# Patient Record
Sex: Male | Born: 1985 | Race: White | Hispanic: Yes | Marital: Married | Smoking: Current every day smoker
Health system: Southern US, Community
[De-identification: ages and names within clinical notes are randomized; demographics above are authoritative.]

---

## 2009-09-16 ENCOUNTER — Encounter: Admission: RE | Admit: 2009-09-16 | Discharge: 2009-09-16 | Payer: Self-pay | Admitting: Family Medicine

## 2010-09-23 ENCOUNTER — Encounter: Admission: RE | Admit: 2010-09-23 | Discharge: 2010-09-23 | Payer: Self-pay | Admitting: Family Medicine

## 2010-11-04 IMAGING — CR DG CHEST 1V
1 series · 1 of 1 positions shown · non-contrast
Comparison: None

CLINICAL DATA: Nearly physical examination for employment.  The
patient is a smoker.

CHEST - 1 VIEW

[view not recorded]
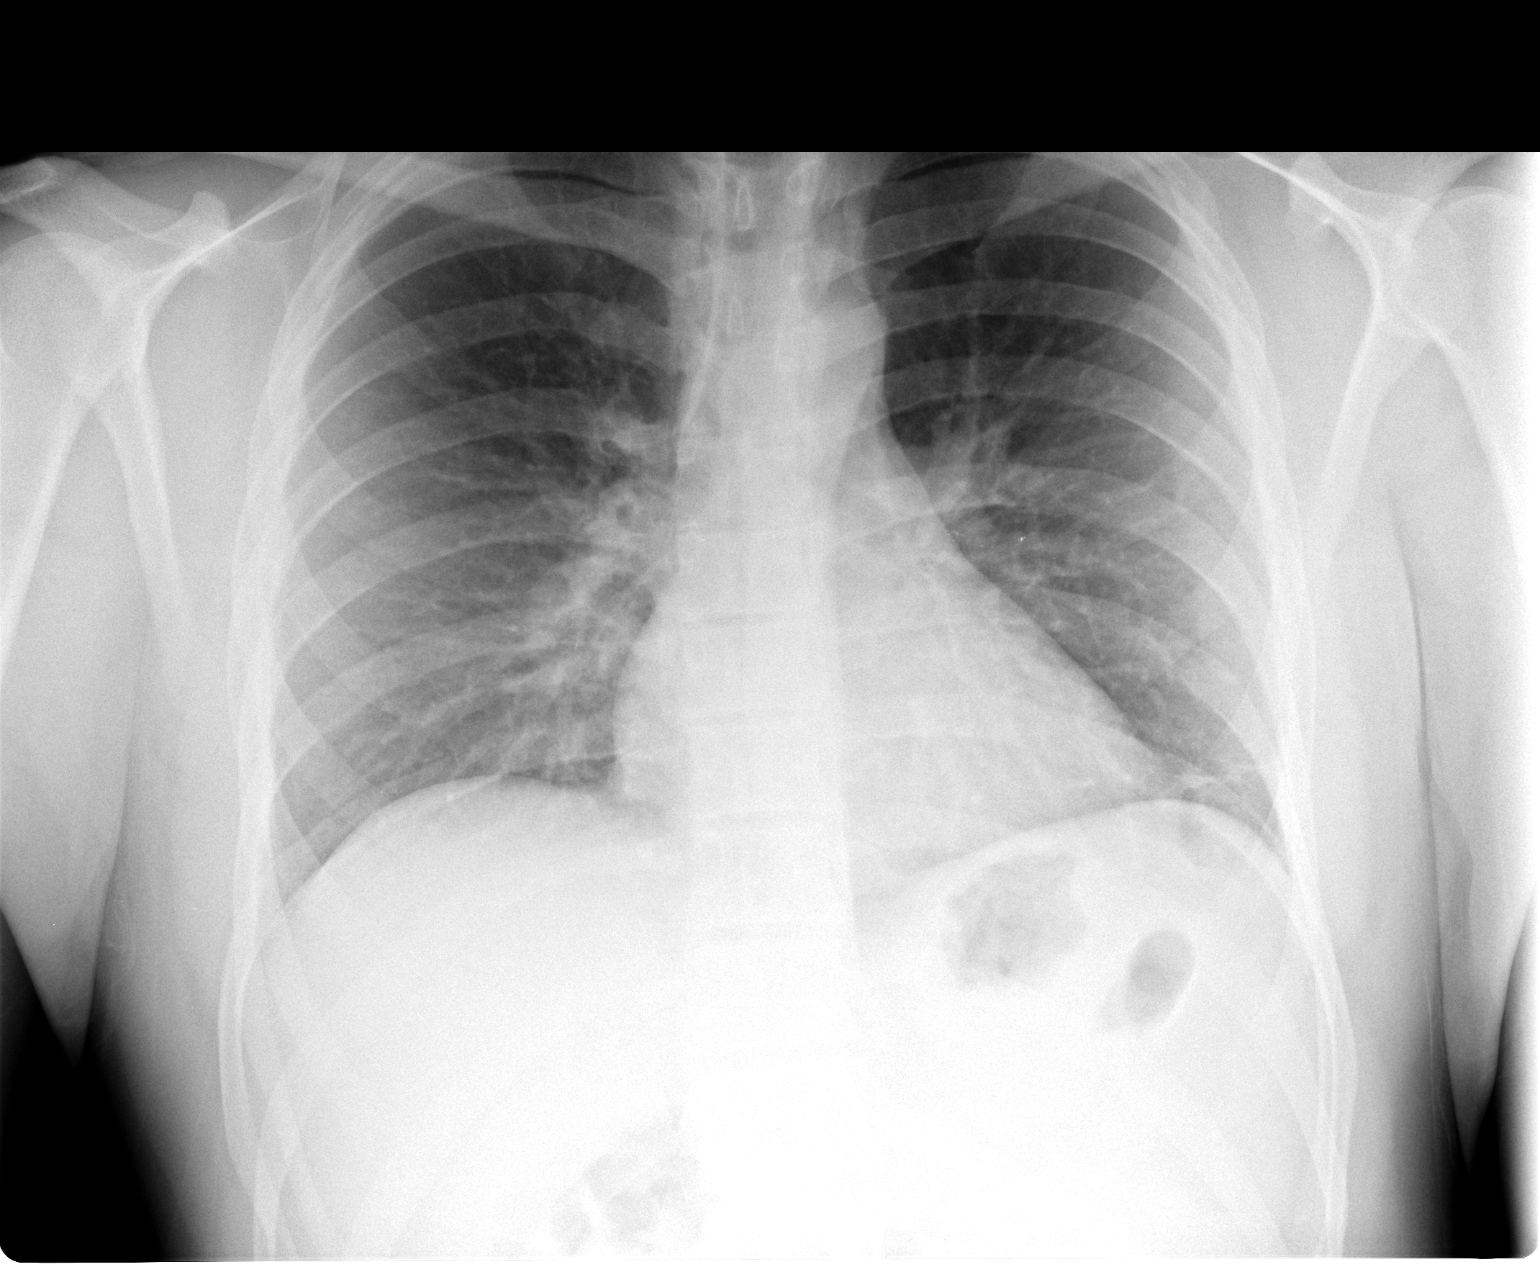

[1 of 1 positions shown; findings below may reference images not displayed]

FINDINGS: Minimal bibasilar scarring and atelectasis present.  No
evidence of edema or infiltrate.  Heart size and mediastinal
contours are normal.  The bony thorax is unremarkable.
IMPRESSION: No active disease.  Minimal bibasilar scarring/atelectasis.

## 2011-11-20 ENCOUNTER — Other Ambulatory Visit: Payer: Self-pay | Admitting: Occupational Medicine

## 2011-11-20 ENCOUNTER — Ambulatory Visit
Admission: RE | Admit: 2011-11-20 | Discharge: 2011-11-20 | Disposition: A | Discharge status: Home / Self Care | Payer: Self-pay | Source: Ambulatory Visit | Attending: Occupational Medicine | Admitting: Occupational Medicine

## 2011-11-20 DIAGNOSIS — Z0000 Encounter for general adult medical examination without abnormal findings: Secondary | ICD-10-CM

## 2013-01-07 IMAGING — CR DG CHEST 1V
1 series · 1 of 1 positions shown · non-contrast
Comparison: 09/23/2010

CLINICAL DATA: Physical exam.

CHEST - 1 VIEW

[view not recorded]
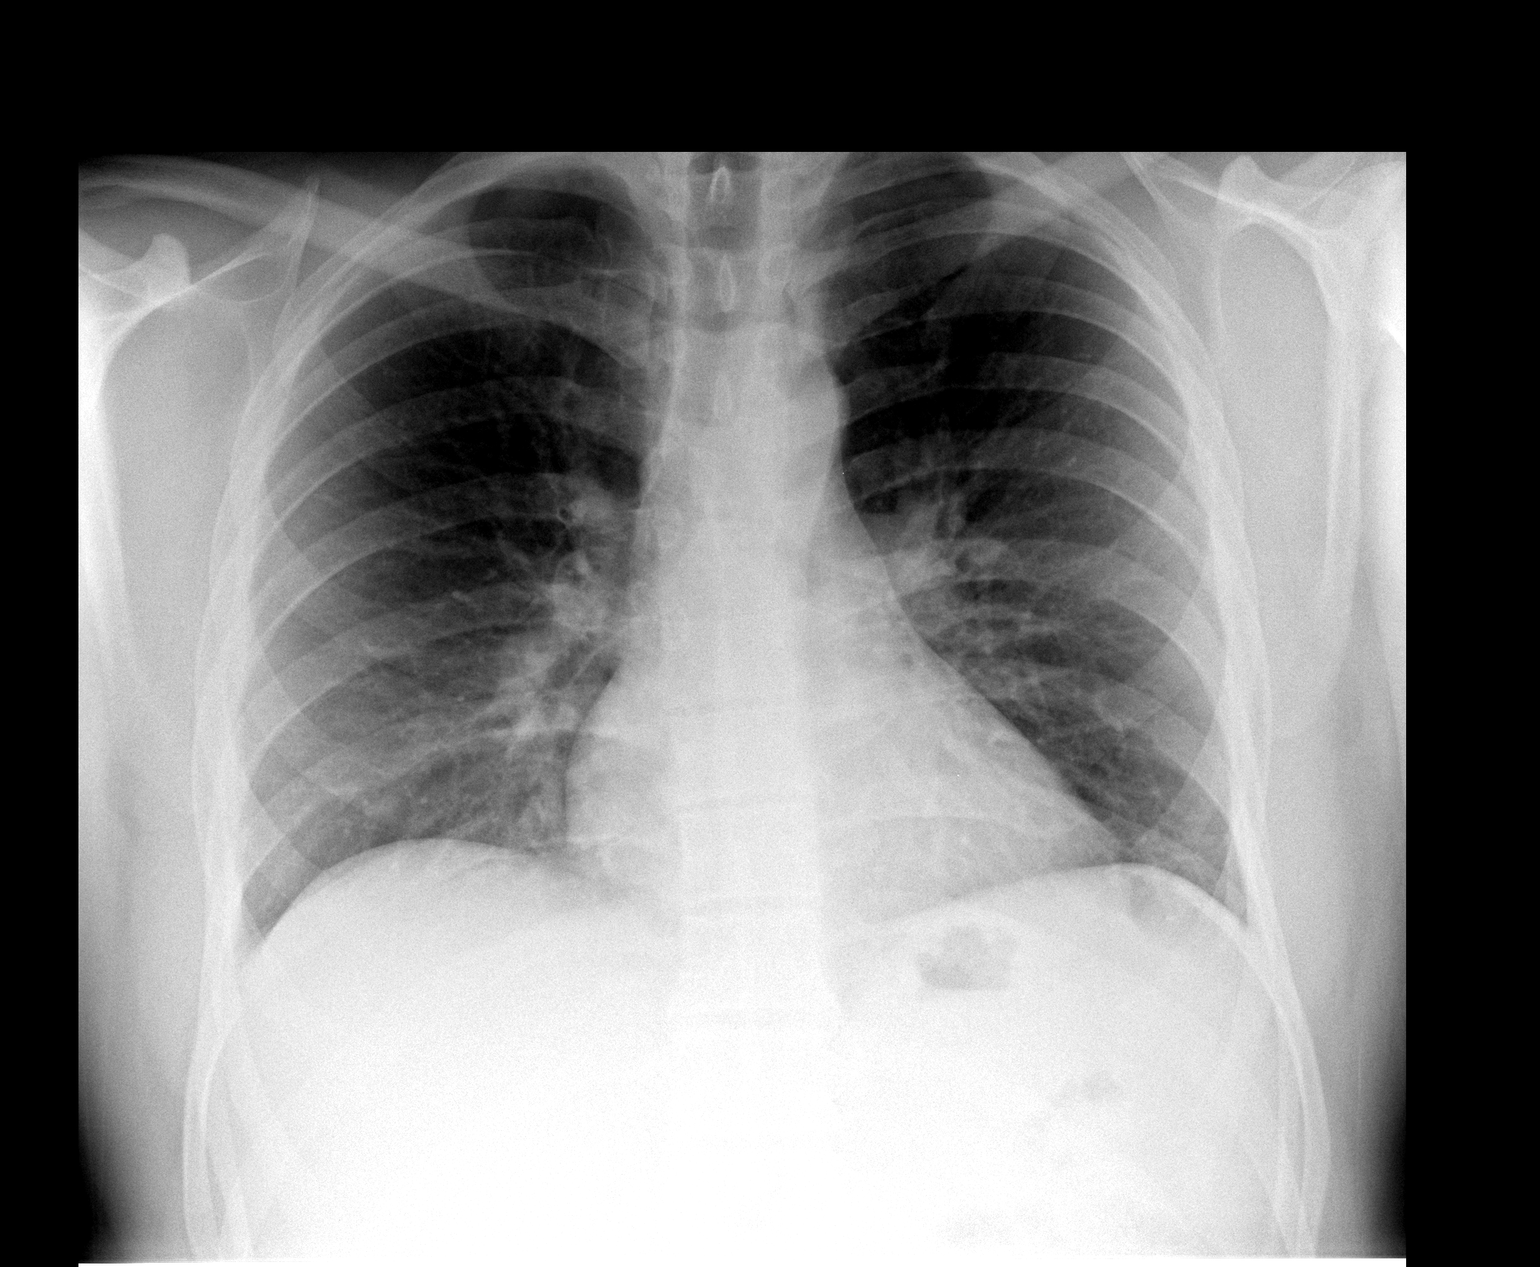

[1 of 1 positions shown; findings below may reference images not displayed]

FINDINGS: Heart and mediastinal contours are within normal limits.
No focal opacities or effusions.  No acute bony abnormality.
IMPRESSION: No active cardiopulmonary disease.

## 2017-09-17 ENCOUNTER — Ambulatory Visit (INDEPENDENT_AMBULATORY_CARE_PROVIDER_SITE_OTHER): Payer: Self-pay

## 2017-09-17 ENCOUNTER — Other Ambulatory Visit: Payer: Self-pay | Admitting: Emergency Medicine

## 2017-09-17 DIAGNOSIS — Z0000 Encounter for general adult medical examination without abnormal findings: Secondary | ICD-10-CM

## 2017-09-17 LAB — BASIC METABOLIC PANEL
BUN: 21 (ref 4–21)
Creatinine: 1 (ref <=1.3)
Glucose: 79
Potassium: 5 (ref 3.4–5.3)
Sodium: 141 (ref 137–147)

## 2017-09-17 LAB — HEPATIC FUNCTION PANEL
ALT: 17 (ref 10–40)
AST: 15 (ref 14–40)
Alkaline Phosphatase: 65 (ref 25–125)
Bilirubin, Direct: 0.4
Bilirubin, Total: 0.4

## 2017-09-17 LAB — PULMONARY FUNCTION TEST

## 2017-09-17 LAB — CBC AND DIFFERENTIAL
HCT: 46 (ref 41–53)
Hemoglobin: 16.1 (ref 13.5–17.5)
Platelets: 210 (ref 150–399)
WBC: 8.5

## 2017-09-17 LAB — LIPID PANEL
Cholesterol: 160 (ref 0–200)
HDL: 39 (ref 35–70)
LDL Cholesterol: 94
Triglycerides: 174 — AB (ref 40–160)

## 2017-09-21 ENCOUNTER — Encounter: Payer: Self-pay | Admitting: Osteopathic Medicine

## 2017-09-21 ENCOUNTER — Ambulatory Visit (INDEPENDENT_AMBULATORY_CARE_PROVIDER_SITE_OTHER): Payer: BLUE CROSS/BLUE SHIELD | Admitting: Osteopathic Medicine

## 2017-09-21 VITALS — BP 126/73 | HR 69 | Ht 73.0 in | Wt 197.0 lb

## 2017-09-21 DIAGNOSIS — E781 Pure hyperglyceridemia: Secondary | ICD-10-CM | POA: Diagnosis not present

## 2017-09-21 DIAGNOSIS — R319 Hematuria, unspecified: Secondary | ICD-10-CM | POA: Insufficient documentation

## 2017-09-21 DIAGNOSIS — R8290 Unspecified abnormal findings in urine: Secondary | ICD-10-CM | POA: Diagnosis not present

## 2017-09-21 DIAGNOSIS — R942 Abnormal results of pulmonary function studies: Secondary | ICD-10-CM | POA: Insufficient documentation

## 2017-09-21 DIAGNOSIS — Z23 Encounter for immunization: Secondary | ICD-10-CM | POA: Diagnosis not present

## 2017-09-21 LAB — POCT URINALYSIS DIPSTICK
Bilirubin, UA: NEGATIVE
Glucose, UA: NEGATIVE
Ketones, UA: NEGATIVE
Leukocytes, UA: NEGATIVE
Nitrite, UA: NEGATIVE
Protein, UA: NEGATIVE
Spec Grav, UA: 1.02 (ref 1.010–1.025)
Urobilinogen, UA: 0.2 E.U./dL
pH, UA: 7.5 (ref 5.0–8.0)

## 2017-09-21 NOTE — Patient Instructions (Signed)
At this point, I do not think any additional labs or testing is needed.   I would ask your employer if you need to follow up with occupational health about the issue with the respirator. I will request records from them for complete review of labs and lung function test.   Let's plan to follow-up in the office to recheck the lungs in another 1 to 3 months, or you can see occupational health about this if your employer requires that.

## 2017-09-21 NOTE — Progress Notes (Signed)
HPI: Jorge Thompson is a 31 y.o. male who  has no past medical history on file.  he presents to [facility] today, 09/21/17,  for chief complaint of:  Chief Complaint  Patient presents with  . Establish Care    discuss blood in urine and high Trigylcerides    Recently evaluated by occupational health. Some concerning findings on exam including abnormal PFTs, urinalysis positive for blood though no RBCs on urine microscopy, elevated tragus right and decreased HDL. Patient states he was instructed to follow-up with PCP about these issues, to establish care and treat/monitor as needed  He brings some records with him from occupational health, nothing particularly helpful as far as complete lab results or PFTs themselves. There is some no duration of PFTs borderline low with FVC 69% predicted, FEV1 over FVC 100% predicted and that these results are slightly better from PFT 11/20/2011. Patient is a smoker. Patient reports shortness of breath after climbing stairs and occasional cough, patient states he does fine after a few floors of steps but if it is 5-10 floors he has some trouble. Seems understandable to me.  He admits to be doing a little bit better as far as diet is concerned. No mention of elevated LDL. HDL is 39, check lesser is 174. Chest x-ray results showed normal  Past medical, surgical, social and family history reviewed:  There are no active problems to display for this patient.  No past surgical history on file.  Social History   Tobacco Use  . Smoking status: Current Every Day Smoker  . Smokeless tobacco: Never Used  Substance Use Topics  . Alcohol use: Not on file    Family History  Problem Relation Age of Onset  . Hypertension Mother      Current medication list and allergy/intolerance information reviewed:    No current outpatient medications on file.   No current facility-administered medications for this visit.      No Known Allergies    Review of Systems:  Constitutional:  No  fever, no chills, No recent illness, No unintentional weight changes. No significant fatigue.   HEENT: No  headache, no vision change, no hearing change, No sore throat, No  sinus pressure  Cardiac: No  chest pain, No  pressure, No palpitations, No  Orthopnea  Respiratory:  No  shortness of breath. +occasional smoker's Cough  Gastrointestinal: No  abdominal pain, No  nausea, No  vomiting,  No  blood in stool, No  diarrhea, No  constipation   Musculoskeletal: No new myalgia/arthralgia  Genitourinary: No  incontinence, No  abnormal genital bleeding, No abnormal genital discharge  Skin: No  Rash, No other wounds/concerning lesions  Hem/Onc: No  easy bruising/bleeding  Endocrine: No cold intolerance,  No heat intolerance. No polyuria/polydipsia/polyphagia   Neurologic: No  weakness, No  dizziness  Psychiatric: No  concerns with depression, No  concerns with anxiety, No sleep problems, No mood problems  Exam:  BP 126/73   Pulse 69   Ht 6\' 1"  (1.854 m)   Wt 197 lb (89.4 kg)   BMI 25.99 kg/m   Constitutional: VS see above. General Appearance: alert, well-developed, well-nourished, NAD  Eyes: Normal lids and conjunctive, non-icteric sclera  Ears, Nose, Mouth, Throat: MMM, Normal external inspection ears/nares/mouth/lips/gums.   Neck: No masses, trachea midline. No thyroid enlargement. No tenderness/mass appreciated. No lymphadenopathy  Respiratory: Normal respiratory effort. Lung sounds a bit diminished. no wheeze, no rhonchi, no rales  Cardiovascular: S1/S2 normal, no  murmur, no rub/gallop auscultated. RRR. No lower extremity edema.   Musculoskeletal: Gait normal. No clubbing/cyanosis of digits.   Neurological: Normal balance/coordination. No tremor.   Skin: warm, dry, intact. No rash/ulcer.   Psychiatric: Normal judgment/insight. Normal mood and affect. Oriented x3.    Results for orders placed or  performed in visit on 09/21/17 (from the past 72 hour(s))  POCT Urinalysis Dipstick     Status: None   Collection Time: 09/21/17 11:17 AM  Result Value Ref Range   Color, UA YELLOW    Clarity, UA CLEAR    Glucose, UA NEGATIVE    Bilirubin, UA NEGATIVE    Ketones, UA NEGATIVE    Spec Grav, UA 1.020 1.010 - 1.025   Blood, UA TRACE    pH, UA 7.5 5.0 - 8.0   Protein, UA NEGATIVE    Urobilinogen, UA 0.2 0.2 or 1.0 E.U./dL   Nitrite, UA NEGATIVE    Leukocytes, UA Negative Negative     ASSESSMENT/PLAN:   Abnormal lung function test - ?restrictive? Need records  Need for influenza vaccination - Plan: Flu Vaccine QUAD 36+ mos IM  Hematuria, unspecified type - Plan: Urine Microscopic, POCT Urinalysis Dipstick - previously seen by urology, no RBC on UA microscopy, will repeat  Abnormal finding on urinalysis  Hypertriglyceridemia - not too concerned, advised quit smoking    Patient Instructions  At this point, I do not think any additional labs or testing is needed.   I would ask your employer if you need to follow up with occupational health about the issue with the respirator. I will request records from them for complete review of labs and lung function test.   Let's plan to follow-up in the office to recheck the lungs in another 1 to 3 months, or you can see occupational health about this if your employer requires that.    Visit summary with medication list and pertinent instructions was printed for patient to review. All questions at time of visit were answered - patient instructed to contact office with any additional concerns. ER/RTC precautions were reviewed with the patient. Follow-up plan: Return for lung function test 1-3 months here or with occupational health. Annual physical here when due .  Note: Total time spent 30 minutes, greater than 50% of the visit was spent face-to-face counseling and coordinating care for the following: The primary encounter diagnosis was  Abnormal lung function test. Diagnoses of Need for influenza vaccination, Hematuria, unspecified type, Abnormal finding on urinalysis, and Hypertriglyceridemia were also pertinent to this visit.Marland Kitchen.  Please note: voice recognition software was used to produce this document, and typos may escape review. Please contact Dr. Lyn HollingsheadAlexander for any needed clarifications.

## 2017-09-26 LAB — URINALYSIS, MICROSCOPIC ONLY
Bacteria, UA: NONE SEEN /HPF
Hyaline Cast: NONE SEEN /LPF
Squamous Epithelial / LPF: NONE SEEN /HPF (ref <=5)
WBC, UA: NONE SEEN /HPF (ref 0–5)

## 2017-10-10 ENCOUNTER — Other Ambulatory Visit: Payer: Self-pay | Admitting: Osteopathic Medicine

## 2018-09-18 ENCOUNTER — Ambulatory Visit (INDEPENDENT_AMBULATORY_CARE_PROVIDER_SITE_OTHER): Payer: Self-pay

## 2018-09-18 ENCOUNTER — Other Ambulatory Visit: Payer: Self-pay | Admitting: Emergency Medicine

## 2018-09-18 DIAGNOSIS — Z0000 Encounter for general adult medical examination without abnormal findings: Secondary | ICD-10-CM

## 2020-03-12 ENCOUNTER — Other Ambulatory Visit: Payer: Self-pay | Admitting: Physician Assistant

## 2020-03-12 ENCOUNTER — Ambulatory Visit (INDEPENDENT_AMBULATORY_CARE_PROVIDER_SITE_OTHER): Payer: Self-pay

## 2020-03-12 ENCOUNTER — Other Ambulatory Visit: Payer: Self-pay

## 2020-03-12 DIAGNOSIS — Z0000 Encounter for general adult medical examination without abnormal findings: Secondary | ICD-10-CM

## 2021-03-28 ENCOUNTER — Emergency Department (HOSPITAL_BASED_OUTPATIENT_CLINIC_OR_DEPARTMENT_OTHER)
Admission: EM | Admit: 2021-03-28 | Discharge: 2021-03-28 | Disposition: A | Discharge status: Home / Self Care | Payer: Worker's Compensation | Attending: Emergency Medicine | Admitting: Emergency Medicine

## 2021-03-28 ENCOUNTER — Encounter (HOSPITAL_BASED_OUTPATIENT_CLINIC_OR_DEPARTMENT_OTHER): Payer: Self-pay | Admitting: *Deleted

## 2021-03-28 ENCOUNTER — Other Ambulatory Visit: Payer: Self-pay

## 2021-03-28 DIAGNOSIS — T7589XA Other specified effects of external causes, initial encounter: Secondary | ICD-10-CM

## 2021-03-28 DIAGNOSIS — Z77118 Contact with and (suspected) exposure to other environmental pollution: Secondary | ICD-10-CM | POA: Insufficient documentation

## 2021-03-28 DIAGNOSIS — F17200 Nicotine dependence, unspecified, uncomplicated: Secondary | ICD-10-CM | POA: Diagnosis not present

## 2021-03-28 NOTE — Discharge Instructions (Signed)
Your exam looks reassuring, please continue with all your medications as prescribed.  You may follow-up with your PCP as needed.  If you develop severe headaches, shortness of breath, chest pain, uncontrolled nausea, vomit, diarrhea will need to be reassessed in the emergency department. 

## 2021-03-28 NOTE — ED Notes (Signed)
Pt exposed to nitrogen at workplace - her to get checked out  No complaints at this time

## 2021-03-28 NOTE — ED Provider Notes (Signed)
[facility] EMERGENCY DEPARTMENT Provider Note   CSN: [id number] Arrival date & time: 03/28/21  1329     History Chief Complaint  Patient presents with  . Chemical Exposure    Jorge Thompson is a 35 y.o. male.  HPI    Patient with no significant medical history presents to the emergency department due to a nitrogen exposure.  Patient states he works for [employer] and today he was inspecting a well ventilated house and found a leak in a nitrogen tank.  Patient states he was wearing a respirator and was exposed to the gas for approximately 30 seconds.  He immediately left the premises and was not having any symptoms at that time.  Patient endorses  that this happened around 10:30 AM today.  He denies headaches, lightheadedness, dizziness, chest pain, shortness of breath, nausea or vomiting.  Patient states he is here because he was instructed to be checked out by his employer.  History reviewed. No pertinent past medical history.  Patient Active Problem List   Diagnosis Date Noted  . Abnormal finding on urinalysis 09/21/2017  . Hematuria 09/21/2017  . Abnormal lung function test 09/21/2017  . Hypertriglyceridemia 09/21/2017    History reviewed. No pertinent surgical history.     Family History  Problem Relation Age of Onset  . Hypertension Mother     Social History   Tobacco Use  . Smoking status: Current Every Day Smoker    Packs/day: 0.50  . Smokeless tobacco: Never Used  Substance Use Topics  . Alcohol use: Not Currently  . Drug use: Not Currently    Home Medications Prior to Admission medications   Not on File    Allergies    Patient has no known allergies.  Review of Systems   Review of Systems  Constitutional: Negative for chills and fever.  HENT: Negative for congestion.   Respiratory: Negative for shortness of breath.   Cardiovascular: Negative for chest pain.  Gastrointestinal: Negative for abdominal pain.   Genitourinary: Negative for enuresis.  Musculoskeletal: Negative for back pain.  Skin: Negative for rash.  Neurological: Negative for dizziness.  Hematological: Does not bruise/bleed easily.    Physical Exam Updated Vital Signs BP (!) 184/89 (BP Location: Right Arm)   Pulse 75   Temp 98.4 F (36.9 C) (Oral)   Resp 16   Ht 6' (1.829 m)   Wt 93 kg   SpO2 99%   BMI 27.80 kg/m   Physical Exam Vitals and nursing note reviewed.  Constitutional:      General: He is not in acute distress.    Appearance: He is not ill-appearing.  HENT:     Head: Normocephalic and atraumatic.     Nose: No congestion.  Eyes:     Conjunctiva/sclera: Conjunctivae normal.  Cardiovascular:     Rate and Rhythm: Normal rate and regular rhythm.     Pulses: Normal pulses.     Heart sounds: No murmur heard. No friction rub. No gallop.   Pulmonary:     Effort: No respiratory distress.     Breath sounds: No wheezing, rhonchi or rales.  Skin:    General: Skin is warm and dry.  Neurological:     Mental Status: He is alert.  Psychiatric:        Mood and Affect: Mood normal.     ED Results / Procedures / Treatments   Labs (all labs ordered are listed, but only abnormal results are displayed) Labs Reviewed - No  data to display  EKG None  Radiology No results found.  Procedures Procedures   Medications Ordered in ED Medications - No data to display  ED Course  I have reviewed the triage vital signs and the nursing notes.  Pertinent labs & imaging results that were available during my care of the patient were reviewed by me and considered in my medical decision making (see chart for details).    MDM Rules/Calculators/A&P                         Initial impression-patient presents after a exposure to nitrogen.  He is alert, does not appear in acute distress, vital signs reassuring.  Will consult with poison control for further recommendations.  Work-up-due to well-appearing patient,  benign physical exam, further lab and imaging not warranted at this time.  Consult spoke with Nonie Hoyer of poison control since exposure was over 4 hours and patient have no symptoms at this time they can be discharged without further monitoring.  Rule out-I have low suspicion for respiratory failure as lung sounds were clear bilaterally, no increase respiratory labor, vital signs reassuring.  low suspicion for nitrogen poisoning as vital signs reassuring, patient has no symptoms at this time.  Plan-  1.  Environmental exposure since resolved-we will have patient monitor symptoms and come back to emergency department if symptoms worsen.  Vital signs have remained stable, no indication for hospital admission.  Patient discussed with attending and they agreed with assessment and plan.  Patient given at home care as well strict return precautions.  Patient verbalized that they understood agreed to said plan. Final Clinical Impression(s) / ED Diagnoses Final diagnoses:  Environmental exposure    Rx / DC Orders ED Discharge Orders    None       Carroll Sage, PA-C 03/28/21 1726    Terrilee Files, MD 03/28/21 [phone]

## 2021-03-28 NOTE — ED Triage Notes (Addendum)
C/o minimal exposure to nitrogen gas , c/o dizziness x 30 secs. No complaints now Spco% 6

## 2021-04-30 IMAGING — DX DG CHEST 1V
1 series · 1 of 1 positions shown · non-contrast
Comparison: 09/18/2018

CLINICAL DATA: 34-year-old male with a history prior smoking and
currently asymptomatic

EXAM:
CHEST  1 VIEW

[chest pa]
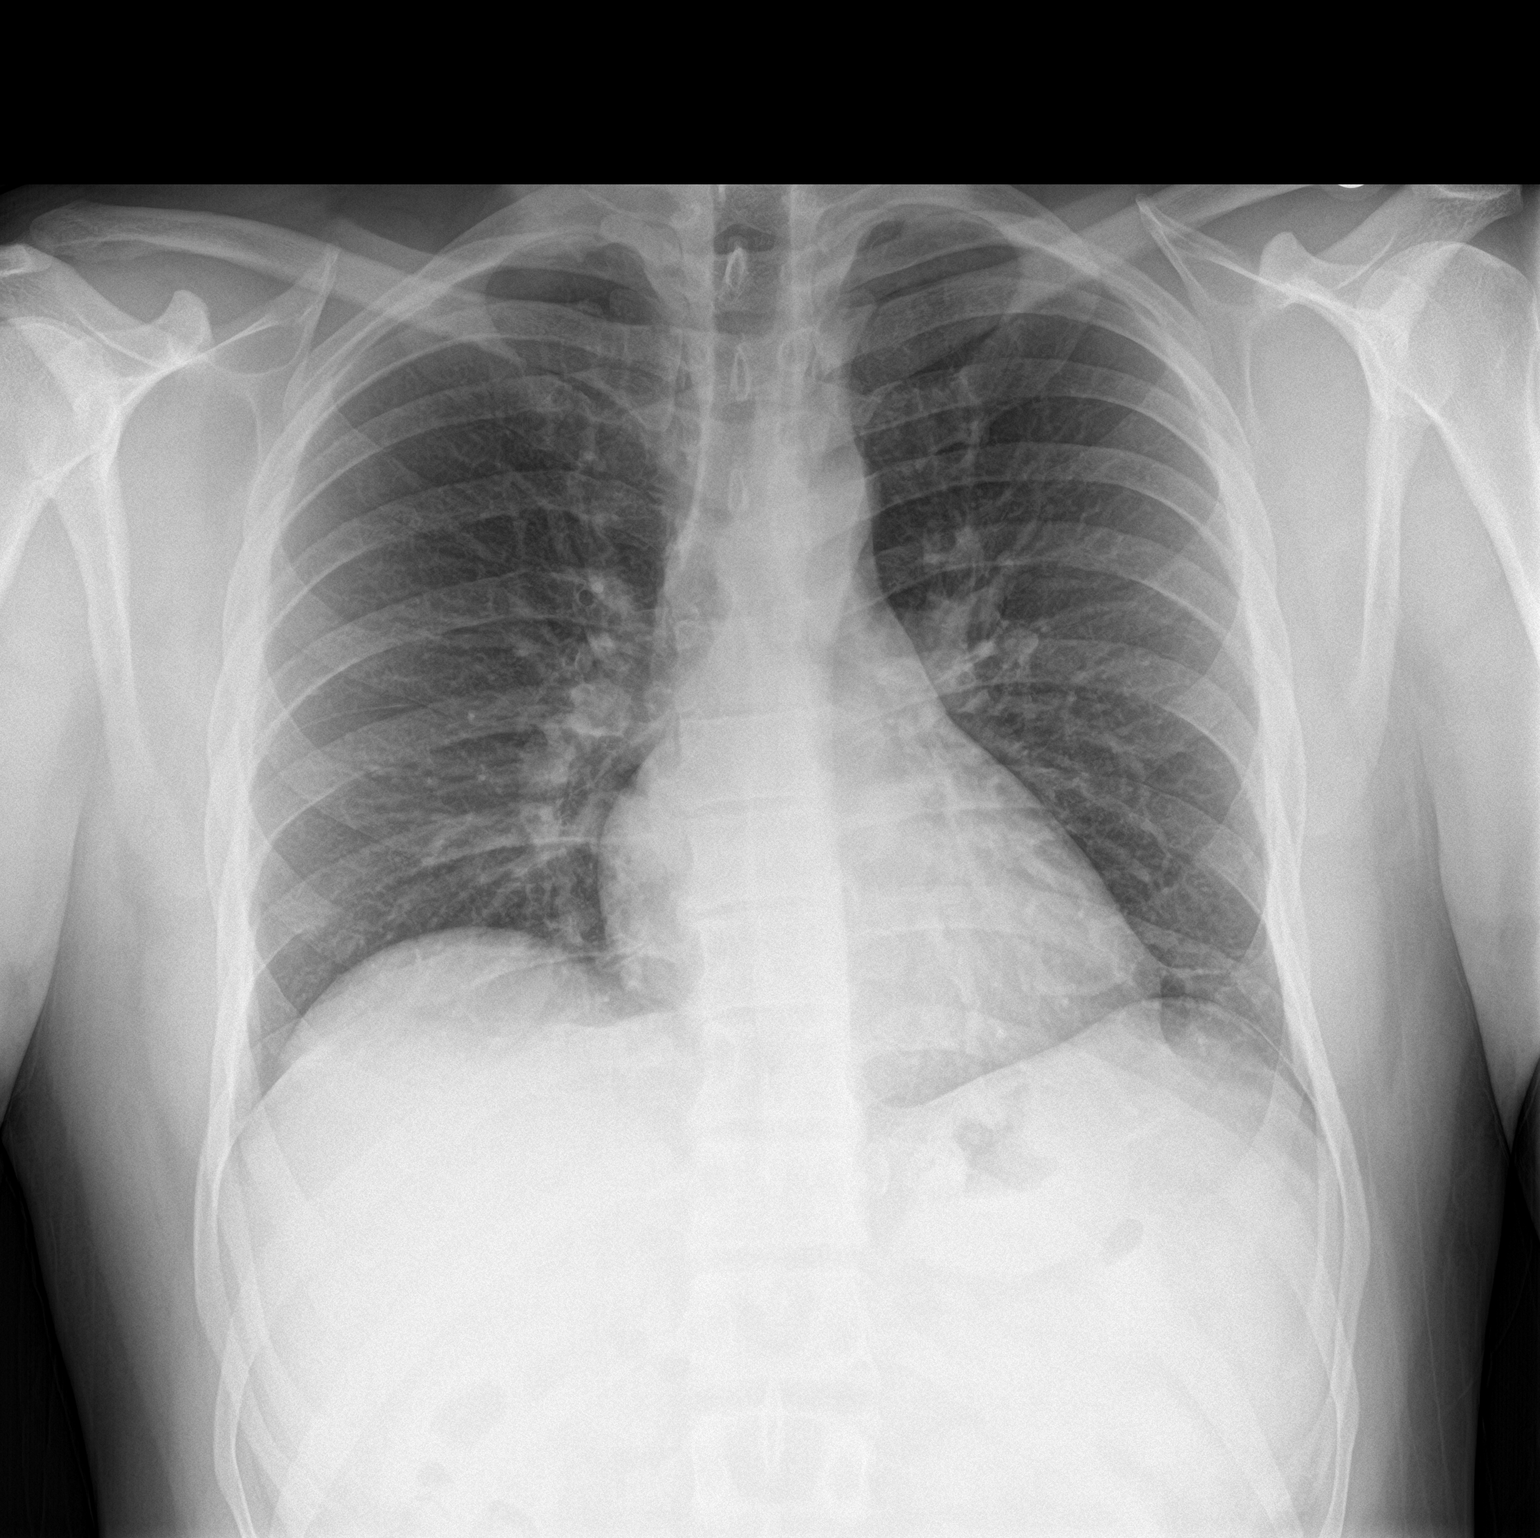

[1 of 1 positions shown; findings below may reference images not displayed]

FINDINGS: Cardiomediastinal silhouette unchanged in size and contour. No
pneumothorax or pleural effusion. No confluent airspace disease.
Linear atelectasis/scarring at the left lung base is unchanged.
IMPRESSION: Negative for acute cardiopulmonary disease

## 2022-03-07 ENCOUNTER — Other Ambulatory Visit: Payer: Self-pay | Admitting: Physician Assistant

## 2022-03-07 ENCOUNTER — Ambulatory Visit (INDEPENDENT_AMBULATORY_CARE_PROVIDER_SITE_OTHER): Payer: Self-pay

## 2022-03-07 DIAGNOSIS — Z0000 Encounter for general adult medical examination without abnormal findings: Secondary | ICD-10-CM

## 2024-01-21 ENCOUNTER — Other Ambulatory Visit: Payer: Self-pay | Admitting: Nurse Practitioner

## 2024-01-21 ENCOUNTER — Ambulatory Visit (INDEPENDENT_AMBULATORY_CARE_PROVIDER_SITE_OTHER): Payer: Worker's Compensation

## 2024-01-21 DIAGNOSIS — R52 Pain, unspecified: Secondary | ICD-10-CM

## 2024-01-21 DIAGNOSIS — S62636A Displaced fracture of distal phalanx of right little finger, initial encounter for closed fracture: Secondary | ICD-10-CM
